# Patient Record
Sex: Female | Born: 1950 | Race: Black or African American | Hispanic: No | Marital: Single | Smoking: Former smoker
Health system: Southern US, Community
[De-identification: ages and names within clinical notes are randomized; demographics above are authoritative.]

## PROBLEM LIST (undated history)

## (undated) DIAGNOSIS — E7800 Pure hypercholesterolemia, unspecified: Secondary | ICD-10-CM

## (undated) DIAGNOSIS — I10 Essential (primary) hypertension: Secondary | ICD-10-CM

---
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
## 1998-07-21 ENCOUNTER — Ambulatory Visit (HOSPITAL_COMMUNITY): Admission: RE | Admit: 1998-07-21 | Discharge: 1998-07-21 | Payer: Self-pay | Admitting: Gastroenterology

## 1999-09-28 ENCOUNTER — Other Ambulatory Visit: Admission: RE | Admit: 1999-09-28 | Discharge: 1999-09-28 | Payer: Self-pay | Admitting: Obstetrics and Gynecology

## 2001-12-01 ENCOUNTER — Ambulatory Visit (HOSPITAL_COMMUNITY): Admission: RE | Admit: 2001-12-01 | Discharge: 2001-12-01 | Payer: Self-pay | Admitting: Gastroenterology

## 2005-12-29 ENCOUNTER — Other Ambulatory Visit: Admission: RE | Admit: 2005-12-29 | Discharge: 2005-12-29 | Payer: Self-pay | Admitting: Family Medicine

## 2007-12-01 ENCOUNTER — Other Ambulatory Visit: Admission: RE | Admit: 2007-12-01 | Discharge: 2007-12-01 | Payer: Self-pay | Admitting: Family Medicine

## 2008-12-10 ENCOUNTER — Emergency Department (HOSPITAL_COMMUNITY): Admission: EM | Admit: 2008-12-10 | Discharge: 2008-12-10 | Payer: Self-pay | Admitting: Emergency Medicine

## 2010-12-18 NOTE — Procedures (Signed)
[facility]  Patient:    Melody Foster, INOUE Visit Number: [id number] MRN: [id number]          Service Type: END Location: ENDO Attending Physician:  Louie Bun Dictated by:   Everardo All Madilyn Fireman, M.D. Proc. Date: 12/01/01 Admit Date:  12/01/2001   CC:         Earl Lites, M.D., [facility]  Fayne Norrie, M.D.   Procedure Report  PROCEDURE:  Colonoscopy.  INDICATION FOR PROCEDURE:  Heme-positive stools with last surveillance colonoscopy four years ago with a history of adenomatous colon polyps.  DESCRIPTION OF PROCEDURE:  The patient was placed in the left lateral decubitus position and placed on the pulse monitor with continuous low-flow oxygen delivered by nasal cannula.  She was sedated with 100 mg of IV Demerol and 10 mg of IV Versed.  The Olympus video colonoscope was inserted into the rectum and advanced to the cecum, confirmed by transillumination at McBurneys point and visualization of the ileocecal valve and appendiceal orifice.  The prep was generally good but suboptimal proximal to the hepatic flexure, and there were some areas I could not adequately lavage free of adherent stool to preclude small lesions less than 1 cm in diameter.  Otherwise, the cecum, ascending, transverse, descending, and sigmoid colon all appeared normal with no masses, polyps, diverticula or other mucosal abnormalities.  The rectum likewise appeared normal down to the anus where retroflex view did reveal some small internal hemorrhoids with no stigma of hemorrhage.  The colonoscope was then withdrawn, and the patient returned to the recovery room in stable condition.  She tolerated the procedure well, and there were no immediate complications.  IMPRESSION: 1. Internal hemorrhoids. 2. Otherwise normal colonoscopy.  PLAN:  Repeat colonoscopy in five years. Dictated by:   Everardo All Madilyn Fireman, M.D. Attending Physician:  Louie Bun DD:   12/01/01 TD:  12/02/01 Job: [id number]

## 2011-11-20 DIAGNOSIS — E7800 Pure hypercholesterolemia, unspecified: Secondary | ICD-10-CM

## 2011-11-20 DIAGNOSIS — I10 Essential (primary) hypertension: Secondary | ICD-10-CM | POA: Insufficient documentation

## 2011-11-20 HISTORY — DX: Essential (primary) hypertension: I10

## 2011-11-20 HISTORY — DX: Pure hypercholesterolemia, unspecified: E78.00

## 2016-06-02 LAB — LIPID PANEL: LDL Cholesterol: 122

## 2016-06-02 LAB — BASIC METABOLIC PANEL: Glucose: 103

## 2017-04-07 ENCOUNTER — Other Ambulatory Visit: Payer: Self-pay | Admitting: Family Medicine

## 2017-04-07 DIAGNOSIS — Z1231 Encounter for screening mammogram for malignant neoplasm of breast: Secondary | ICD-10-CM

## 2017-04-22 ENCOUNTER — Ambulatory Visit
Admission: RE | Admit: 2017-04-22 | Discharge: 2017-04-22 | Disposition: A | Discharge status: Home / Self Care | Payer: Medicare Other | Source: Ambulatory Visit | Attending: Family Medicine | Admitting: Family Medicine

## 2017-04-22 DIAGNOSIS — Z1231 Encounter for screening mammogram for malignant neoplasm of breast: Secondary | ICD-10-CM

## 2018-06-13 ENCOUNTER — Other Ambulatory Visit: Payer: Self-pay | Admitting: Family Medicine

## 2018-06-13 DIAGNOSIS — Z1231 Encounter for screening mammogram for malignant neoplasm of breast: Secondary | ICD-10-CM

## 2018-07-25 ENCOUNTER — Ambulatory Visit
Admission: RE | Admit: 2018-07-25 | Discharge: 2018-07-25 | Disposition: A | Discharge status: Home / Self Care | Payer: Medicare Other | Source: Ambulatory Visit | Attending: Family Medicine | Admitting: Family Medicine

## 2018-07-25 DIAGNOSIS — Z1231 Encounter for screening mammogram for malignant neoplasm of breast: Secondary | ICD-10-CM

## 2019-05-21 DIAGNOSIS — E7800 Pure hypercholesterolemia, unspecified: Secondary | ICD-10-CM | POA: Insufficient documentation

## 2019-05-21 DIAGNOSIS — I10 Essential (primary) hypertension: Secondary | ICD-10-CM

## 2019-09-07 ENCOUNTER — Other Ambulatory Visit: Payer: Self-pay | Admitting: Family Medicine

## 2019-09-07 DIAGNOSIS — Z1231 Encounter for screening mammogram for malignant neoplasm of breast: Secondary | ICD-10-CM

## 2019-10-17 ENCOUNTER — Ambulatory Visit: Payer: Medicare Other

## 2019-10-18 ENCOUNTER — Other Ambulatory Visit: Payer: Self-pay

## 2019-10-18 ENCOUNTER — Ambulatory Visit
Admission: RE | Admit: 2019-10-18 | Discharge: 2019-10-18 | Disposition: A | Discharge status: Home / Self Care | Payer: Medicare Other | Source: Ambulatory Visit | Attending: Family Medicine | Admitting: Family Medicine

## 2019-10-18 ENCOUNTER — Ambulatory Visit: Payer: Medicare Other

## 2019-10-18 DIAGNOSIS — Z1231 Encounter for screening mammogram for malignant neoplasm of breast: Secondary | ICD-10-CM

## 2020-08-13 DIAGNOSIS — H259 Unspecified age-related cataract: Secondary | ICD-10-CM | POA: Diagnosis not present

## 2020-10-06 ENCOUNTER — Other Ambulatory Visit: Payer: Self-pay | Admitting: Family Medicine

## 2020-10-06 DIAGNOSIS — Z1231 Encounter for screening mammogram for malignant neoplasm of breast: Secondary | ICD-10-CM

## 2020-11-04 DIAGNOSIS — H2511 Age-related nuclear cataract, right eye: Secondary | ICD-10-CM | POA: Diagnosis not present

## 2020-12-24 DIAGNOSIS — E7800 Pure hypercholesterolemia, unspecified: Secondary | ICD-10-CM | POA: Diagnosis not present

## 2020-12-24 DIAGNOSIS — I10 Essential (primary) hypertension: Secondary | ICD-10-CM | POA: Diagnosis not present

## 2020-12-26 DIAGNOSIS — L918 Other hypertrophic disorders of the skin: Secondary | ICD-10-CM | POA: Diagnosis not present

## 2020-12-26 DIAGNOSIS — L989 Disorder of the skin and subcutaneous tissue, unspecified: Secondary | ICD-10-CM | POA: Diagnosis not present

## 2021-01-27 ENCOUNTER — Ambulatory Visit: Payer: Medicare Other

## 2021-01-29 ENCOUNTER — Inpatient Hospital Stay: Admission: RE | Admit: 2021-01-29 | Payer: Medicare Other | Source: Ambulatory Visit

## 2021-07-24 DIAGNOSIS — Z1389 Encounter for screening for other disorder: Secondary | ICD-10-CM | POA: Diagnosis not present

## 2021-07-24 DIAGNOSIS — Z0000 Encounter for general adult medical examination without abnormal findings: Secondary | ICD-10-CM | POA: Diagnosis not present

## 2021-07-24 DIAGNOSIS — E7800 Pure hypercholesterolemia, unspecified: Secondary | ICD-10-CM | POA: Diagnosis not present

## 2021-07-24 DIAGNOSIS — I10 Essential (primary) hypertension: Secondary | ICD-10-CM | POA: Diagnosis not present

## 2021-08-18 ENCOUNTER — Ambulatory Visit
Admission: RE | Admit: 2021-08-18 | Discharge: 2021-08-18 | Disposition: A | Discharge status: Home / Self Care | Payer: Medicare Other | Source: Ambulatory Visit | Attending: Family Medicine | Admitting: Family Medicine

## 2021-08-18 DIAGNOSIS — Z1231 Encounter for screening mammogram for malignant neoplasm of breast: Secondary | ICD-10-CM | POA: Diagnosis not present

## 2021-11-12 DIAGNOSIS — R350 Frequency of micturition: Secondary | ICD-10-CM | POA: Diagnosis not present

## 2022-01-21 DIAGNOSIS — I10 Essential (primary) hypertension: Secondary | ICD-10-CM | POA: Diagnosis not present

## 2022-01-21 DIAGNOSIS — E7800 Pure hypercholesterolemia, unspecified: Secondary | ICD-10-CM | POA: Diagnosis not present

## 2022-08-05 DIAGNOSIS — Z0000 Encounter for general adult medical examination without abnormal findings: Secondary | ICD-10-CM | POA: Diagnosis not present

## 2022-08-05 DIAGNOSIS — E7800 Pure hypercholesterolemia, unspecified: Secondary | ICD-10-CM | POA: Diagnosis not present

## 2022-08-05 DIAGNOSIS — Z86010 Personal history of colonic polyps: Secondary | ICD-10-CM | POA: Diagnosis not present

## 2022-08-05 DIAGNOSIS — I10 Essential (primary) hypertension: Secondary | ICD-10-CM | POA: Diagnosis not present

## 2022-08-05 DIAGNOSIS — D2230 Melanocytic nevi of unspecified part of face: Secondary | ICD-10-CM | POA: Diagnosis not present

## 2022-08-17 ENCOUNTER — Other Ambulatory Visit: Payer: Self-pay | Admitting: Family Medicine

## 2022-08-17 DIAGNOSIS — Z1231 Encounter for screening mammogram for malignant neoplasm of breast: Secondary | ICD-10-CM

## 2022-08-19 DIAGNOSIS — D485 Neoplasm of uncertain behavior of skin: Secondary | ICD-10-CM | POA: Diagnosis not present

## 2022-08-19 DIAGNOSIS — L821 Other seborrheic keratosis: Secondary | ICD-10-CM | POA: Diagnosis not present

## 2022-08-20 ENCOUNTER — Ambulatory Visit
Admission: RE | Admit: 2022-08-20 | Discharge: 2022-08-20 | Disposition: A | Discharge status: Home / Self Care | Payer: Medicare Other | Source: Ambulatory Visit | Attending: Family Medicine | Admitting: Family Medicine

## 2022-08-20 DIAGNOSIS — Z1231 Encounter for screening mammogram for malignant neoplasm of breast: Secondary | ICD-10-CM | POA: Diagnosis not present

## 2022-10-28 ENCOUNTER — Ambulatory Visit: Payer: Medicare Other | Admitting: Cardiology

## 2022-10-28 ENCOUNTER — Other Ambulatory Visit: Payer: Medicare Other

## 2022-10-28 ENCOUNTER — Encounter: Payer: Self-pay | Admitting: Cardiology

## 2022-10-28 VITALS — BP 136/69 | HR 73 | Ht 68.0 in | Wt 182.0 lb

## 2022-10-28 DIAGNOSIS — R001 Bradycardia, unspecified: Secondary | ICD-10-CM

## 2022-10-28 DIAGNOSIS — E7800 Pure hypercholesterolemia, unspecified: Secondary | ICD-10-CM | POA: Diagnosis not present

## 2022-10-28 DIAGNOSIS — I10 Essential (primary) hypertension: Secondary | ICD-10-CM | POA: Diagnosis not present

## 2022-10-28 NOTE — Progress Notes (Signed)
Patient referred by Gaynelle Arabian, MD for Bradycardia  Subjective:   Melody Foster, female    DOB: January 30, 1951, 72 y.o.   MRN: [id number]   Chief Complaint  Patient presents with   Bradycardia   New Patient (Initial Visit)     HPI  72 y.o.  female with significant PMH but not limited to hypertension and hypercholesterolemia who present to the office today to establish care (10/28/2022) and further cardiac evaluation for bradycardia.  Of notes, patient was supposed to undergo a colonoscopy (10/13/2022) but was canceled because of significant bradycardia. Heart rate was in mid 30s, and had been taking atenolol.   Today she denies chest pain, shortness of breath, palpitations, leg edema, orthopnea, PND, TIA/syncope. She states her kids admit that she snores. She states that she took the atenolol prior to the procedure and is still taking the medications as prescribed. She says she works/runs a [employer]. She is a former smoker who quit back in 2009, endorses caffeine (1 cup occasionally). She walks frequently.    Past Medical History:  Diagnosis Date   High blood pressure 11/20/2011   High cholesterol 11/20/2011     History reviewed. No pertinent surgical history.   Social History   Tobacco Use  Smoking Status Former   Types: Cigarettes   Quit date: 08/03/2007   Years since quitting: 15.2  Smokeless Tobacco Not on file    Social History   Substance and Sexual Activity  Alcohol Use Never     Family History  Problem Relation Age of Onset   Colon cancer Mother    High Cholesterol Other    Breast cancer Neg Hx      Current Outpatient Medications:    atenolol-chlorthalidone (TENORETIC) 100-25 MG tablet, Take 1 tablet by mouth daily., Disp: , Rfl:    lisinopril (ZESTRIL) 10 MG tablet, Take 10 mg by mouth daily., Disp: , Rfl:    potassium chloride (KLOR-CON) 10 MEQ tablet, Take 10 mEq by mouth daily., Disp: , Rfl:    rosuvastatin (CRESTOR) 5 MG tablet,  Take 1 tablet by mouth daily., Disp: , Rfl:    Cardiovascular and other pertinent studies:  Reviewed external labs and tests, independently interpreted   EKG  10/28/2022: Sinus rhythm 68 bpm Occasional PAC   Nonspecific ST depression -Nondiagnostic     Recent labs: 08/05/2022: Glucose 95, BUN/Cr 16/0.66. EGFR 94. Na/K 145/3.7. Rest of the CMP normal Chol 179, TG 135, HDL 58, LDL 98    Review of Systems  Constitutional: Negative.  Cardiovascular: Negative.   Respiratory: Negative.    Musculoskeletal: Negative.   Neurological: Negative.   Psychiatric/Behavioral: Negative.          Vitals:   10/28/22 1305  BP: 136/69  Pulse: 73  SpO2: 93%     Body mass index is 27.67 kg/m. Filed Weights   10/28/22 1305  Weight: 82.6 kg     Objective:   Physical Exam Constitutional:      Appearance: Normal appearance.  Cardiovascular:     Rate and Rhythm: Normal rate and regular rhythm.     Pulses: Normal pulses.     Heart sounds: Normal heart sounds. No murmur heard.    No gallop.  Pulmonary:     Effort: Pulmonary effort is normal.     Breath sounds: Normal breath sounds.  Musculoskeletal:     Right lower leg: No edema.     Left lower leg: No edema.  Skin:    General:  Skin is warm and dry.  Neurological:     Mental Status: She is alert. Mental status is at baseline.  Psychiatric:        Mood and Affect: Mood normal.        Visit diagnoses:   ICD-10-CM   1. Bradycardia  R00.1 EKG 12-Lead    LONG TERM MONITOR (3-14 DAYS)    2. Hypercholesteremia  E78.00     3. Essential hypertension  I10        Orders Placed This Encounter  Procedures   LONG TERM MONITOR (3-14 DAYS)   EKG 12-Lead     Medication changes this visit: Medications Discontinued During This Encounter  Medication Reason   lovastatin (MEVACOR) 20 MG tablet Patient Preference    No orders of the defined types were placed in this encounter.    Assessment & Recommendations:   72 y.o.   female with significant PMH but not limited to hypertension and hypercholesterolemia who present to the office today to establish care (10/28/2022) and further cardiac evaluation for bradycardia.  1. Bradycardia EKG personally reviewed, and is in SR with rate of 68 bpm. Personally reviewed EKG from day of procedure, and appears to be sinus arrhythmia. I do not think we need to stop or reduce her medications at this time as her BP and HR well controlled on current medications.  Plan  - Recommend holding dose of atenolol prior to procedure  - Zio monitor for 1 week   2. Hypercholesteremia - Continue taking current medications - Continue to follow up with PCP for further management   3. Essential hypertension - Continue taking current medications - Continue to follow up with PCP for further management   Follow up as needed  Thank you for referring the patient to [place]. Please feel free to contact with any questions.  Erma Heritage, NP-S   Nigel Mormon, MD Pager: [phone] Office: [phone]

## 2022-11-10 DIAGNOSIS — R001 Bradycardia, unspecified: Secondary | ICD-10-CM | POA: Diagnosis not present

## 2022-11-21 DIAGNOSIS — R001 Bradycardia, unspecified: Secondary | ICD-10-CM | POA: Diagnosis not present

## 2022-12-14 DIAGNOSIS — Z86010 Personal history of colonic polyps: Secondary | ICD-10-CM | POA: Diagnosis not present

## 2022-12-14 DIAGNOSIS — D125 Benign neoplasm of sigmoid colon: Secondary | ICD-10-CM | POA: Diagnosis not present

## 2022-12-14 DIAGNOSIS — K644 Residual hemorrhoidal skin tags: Secondary | ICD-10-CM | POA: Diagnosis not present

## 2022-12-14 DIAGNOSIS — Z09 Encounter for follow-up examination after completed treatment for conditions other than malignant neoplasm: Secondary | ICD-10-CM | POA: Diagnosis not present

## 2022-12-14 DIAGNOSIS — K648 Other hemorrhoids: Secondary | ICD-10-CM | POA: Diagnosis not present

## 2022-12-16 DIAGNOSIS — D125 Benign neoplasm of sigmoid colon: Secondary | ICD-10-CM | POA: Diagnosis not present

## 2023-09-08 ENCOUNTER — Other Ambulatory Visit: Payer: Self-pay | Admitting: Family Medicine

## 2023-09-08 DIAGNOSIS — Z1231 Encounter for screening mammogram for malignant neoplasm of breast: Secondary | ICD-10-CM

## 2023-09-20 ENCOUNTER — Ambulatory Visit
Admission: RE | Admit: 2023-09-20 | Discharge: 2023-09-20 | Disposition: A | Discharge status: Home / Self Care | Payer: Medicare Other | Source: Ambulatory Visit | Attending: Family Medicine | Admitting: Family Medicine

## 2023-09-20 DIAGNOSIS — Z1231 Encounter for screening mammogram for malignant neoplasm of breast: Secondary | ICD-10-CM

## 2023-10-24 IMAGING — MG MM DIGITAL SCREENING BILAT W/ TOMO AND CAD
8 series · 8 of 24 positions shown · non-contrast
Comparison: Previous exam(s).

CLINICAL DATA: Screening.

EXAM:
DIGITAL SCREENING BILATERAL MAMMOGRAM WITH TOMOSYNTHESIS AND CAD
TECHNIQUE: Bilateral screening digital craniocaudal and mediolateral oblique
mammograms were obtained. Bilateral screening digital breast
tomosynthesis was performed. The images were evaluated with
computer-aided detection.

[R CC synth-2D]
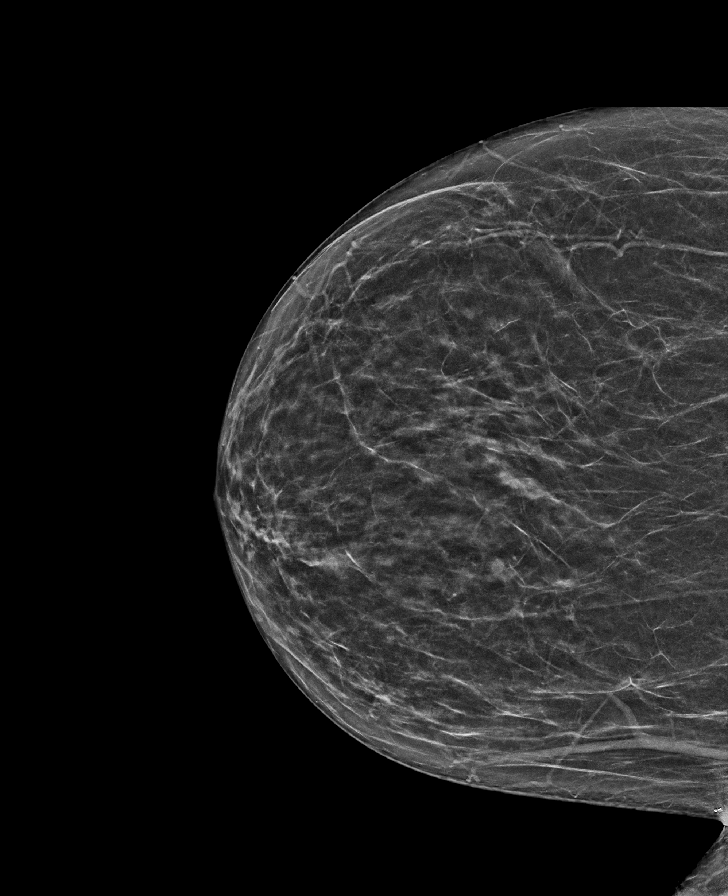

[R MLO synth-2D]
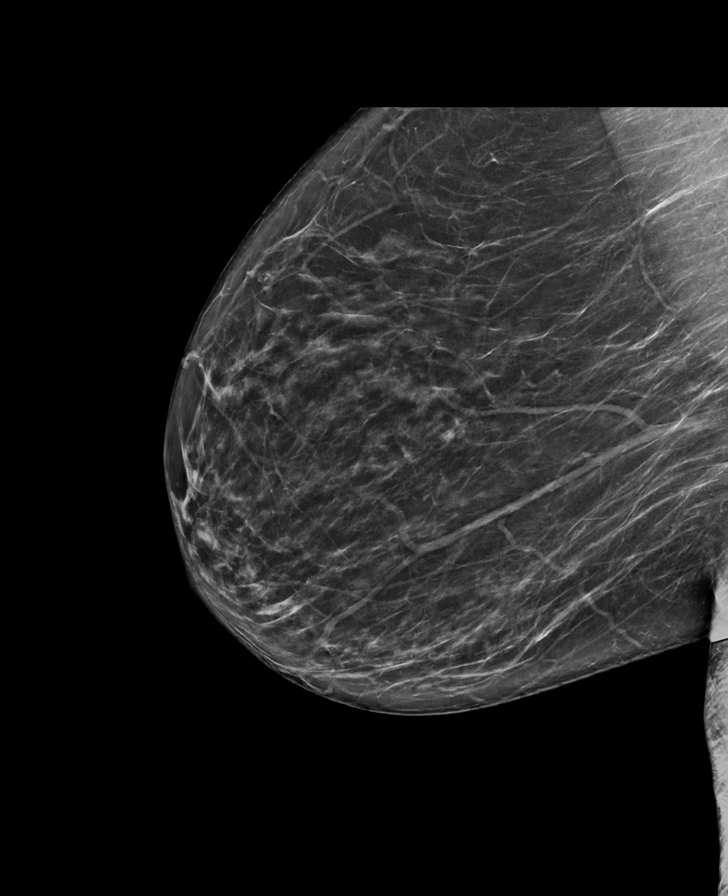

[L MLO synth-2D]
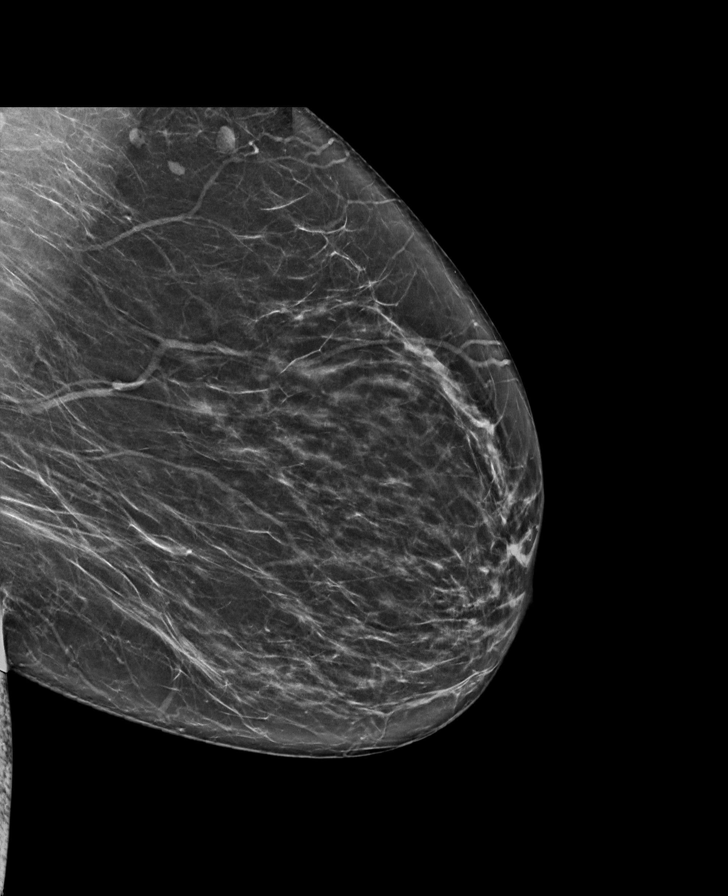

[L CC synth-2D]
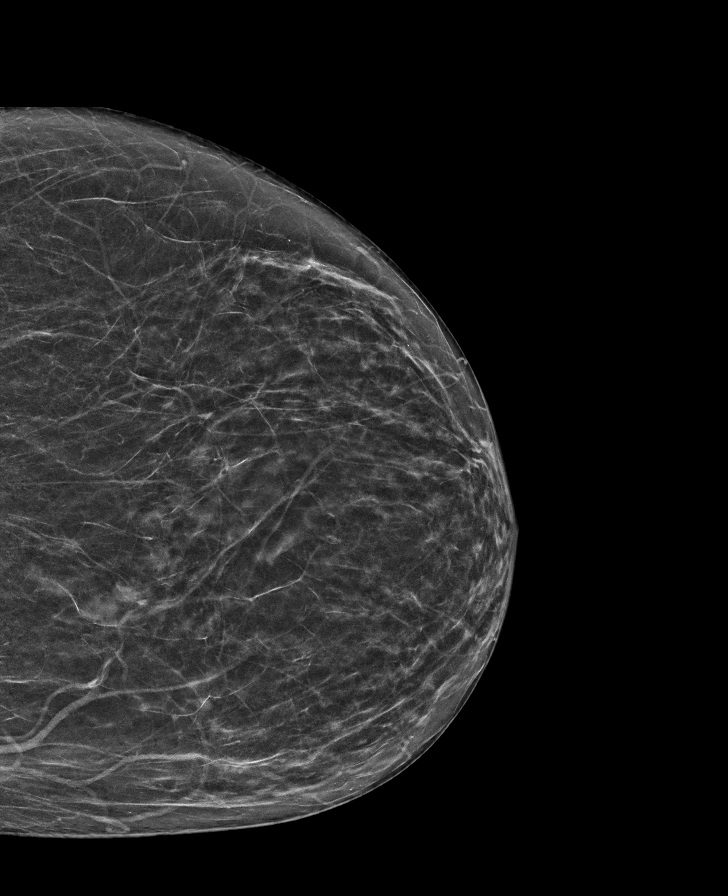

[R MLO tomo · tomo slice 37/72.0]
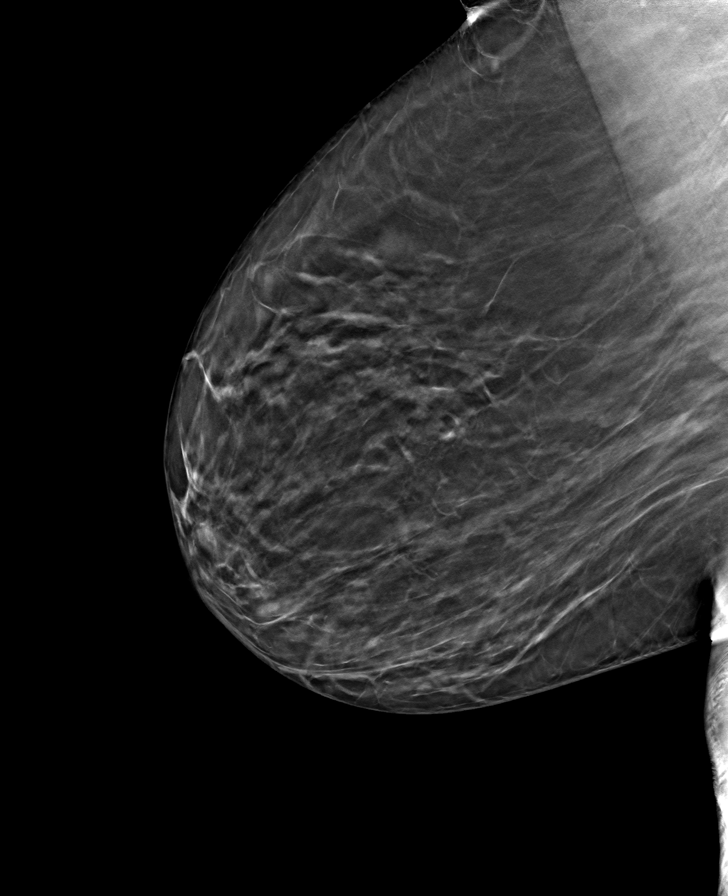

[L CC tomo · tomo slice 31/61.0]
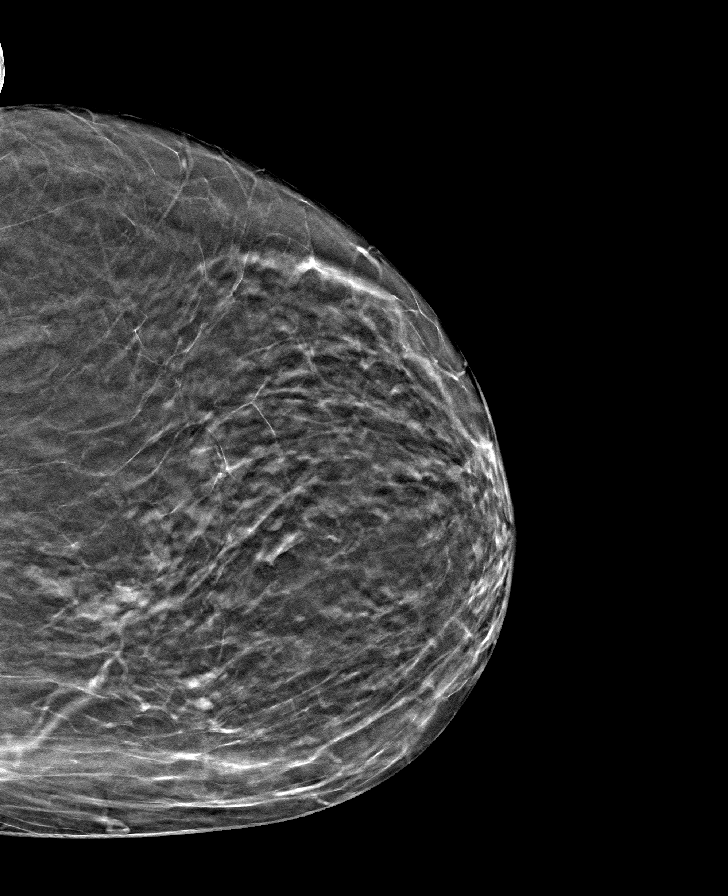

[L MLO tomo · tomo slice 37/74.0]
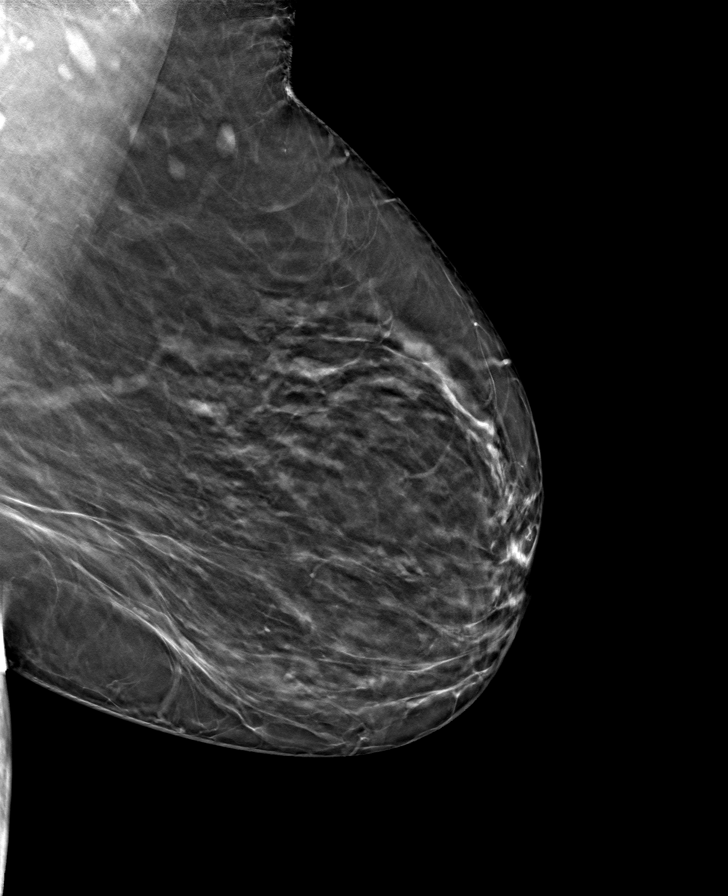

[R CC tomo · tomo slice 32/63.0]
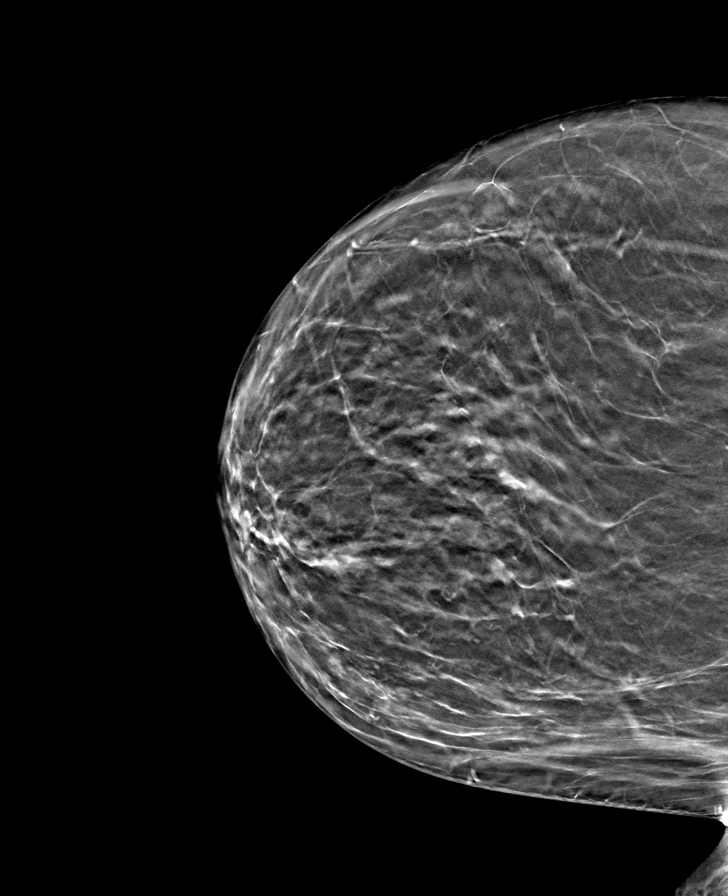

[8 of 24 positions shown; findings below may reference images not displayed]

ACR Breast Density Category b: There are scattered areas of
fibroglandular density.
FINDINGS: There are no findings suspicious for malignancy.
IMPRESSION: No mammographic evidence of malignancy. A result letter of this
screening mammogram will be mailed directly to the patient.

RECOMMENDATION:
Screening mammogram in one year. (Code:51-O-LD2)

BI-RADS CATEGORY  1: Negative.

## 2024-02-20 DIAGNOSIS — E782 Mixed hyperlipidemia: Secondary | ICD-10-CM | POA: Diagnosis not present

## 2024-02-20 DIAGNOSIS — M791 Myalgia, unspecified site: Secondary | ICD-10-CM | POA: Diagnosis not present

## 2024-02-20 DIAGNOSIS — R7303 Prediabetes: Secondary | ICD-10-CM | POA: Diagnosis not present

## 2024-02-20 DIAGNOSIS — I10 Essential (primary) hypertension: Secondary | ICD-10-CM | POA: Diagnosis not present
